# Patient Record
Sex: Male | Born: 1985 | Race: Asian | Hispanic: No | Marital: Married | Smoking: Never smoker
Health system: Southern US, Community
[De-identification: ages and names within clinical notes are randomized; demographics above are authoritative.]

---

## 2014-12-17 ENCOUNTER — Encounter (HOSPITAL_BASED_OUTPATIENT_CLINIC_OR_DEPARTMENT_OTHER): Payer: Self-pay | Admitting: *Deleted

## 2014-12-17 ENCOUNTER — Emergency Department (HOSPITAL_BASED_OUTPATIENT_CLINIC_OR_DEPARTMENT_OTHER): Payer: Commercial Managed Care - HMO

## 2014-12-17 ENCOUNTER — Emergency Department (HOSPITAL_BASED_OUTPATIENT_CLINIC_OR_DEPARTMENT_OTHER)
Admission: EM | Admit: 2014-12-17 | Discharge: 2014-12-17 | Disposition: A | Discharge status: Home / Self Care | Payer: Commercial Managed Care - HMO | Attending: Emergency Medicine | Admitting: Emergency Medicine

## 2014-12-17 DIAGNOSIS — S90852A Superficial foreign body, left foot, initial encounter: Secondary | ICD-10-CM | POA: Diagnosis not present

## 2014-12-17 DIAGNOSIS — Y9301 Activity, walking, marching and hiking: Secondary | ICD-10-CM | POA: Diagnosis not present

## 2014-12-17 DIAGNOSIS — Y9289 Other specified places as the place of occurrence of the external cause: Secondary | ICD-10-CM | POA: Diagnosis not present

## 2014-12-17 DIAGNOSIS — S90851A Superficial foreign body, right foot, initial encounter: Secondary | ICD-10-CM | POA: Diagnosis present

## 2014-12-17 DIAGNOSIS — Y998 Other external cause status: Secondary | ICD-10-CM | POA: Insufficient documentation

## 2014-12-17 DIAGNOSIS — W228XXA Striking against or struck by other objects, initial encounter: Secondary | ICD-10-CM | POA: Diagnosis not present

## 2014-12-17 MED ORDER — BACITRACIN ZINC 500 UNIT/GM EX OINT
1.0000 "application " | TOPICAL_OINTMENT | Freq: Two times a day (BID) | CUTANEOUS | Status: DC
  Administered 2014-12-17: 1 via TOPICAL
  Filled 2014-12-17: qty 28.35

## 2014-12-17 MED ORDER — CHLORHEXIDINE GLUCONATE 4 % EX LIQD: Freq: Every day | CUTANEOUS | Status: AC | PRN

## 2014-12-17 MED ORDER — IBUPROFEN 800 MG PO TABS
800.0000 mg | ORAL_TABLET | Freq: Once | ORAL | Status: AC
  Administered 2014-12-17: 800 mg via ORAL
  Filled 2014-12-17: qty 1

## 2014-12-17 NOTE — Discharge Instructions (Signed)
Sliver Removal °You have had a sliver (splinter) removed. This has caused a wound that extends through some or all layers of the skin and possibly into the subcutaneous tissue. This is the tissue just beneath the skin. Because these wounds can not be cleaned well, it is necessary to watch closely for infection. °AFTER THE PROCEDURE  °If a cut (incision) was necessary to remove this, it may have been repaired for you by your caregiver either with suturing, stapling, or adhesive strips. These keep together the skin edges and allow better and faster healing. °HOME CARE INSTRUCTIONS  °· A dressing may have been applied. This may be changed once per day or as instructed. If the dressing sticks, it may be soaked off with a gauze pad or clean cloth that has been dampened with soapy water or hydrogen peroxide. °· It is difficult to remove all slivers or foreign bodies as they may break or splinter into smaller pieces. Be aware that your body will work to remove the foreign substance. That is, the foreign body may work itself out of the wound. That is normal. °· Watch for signs of infection and notify your caregiver if you suspect a sliver or foreign body remains in the wound. °· You may have received a recommendation to follow up with your physician or a specialist. It is very important to call for or keep follow-up appointments in order to avoid infection or other complications. °· Only take over-the-counter or prescription medicines for pain, discomfort, or fever as directed by your caregiver. °· If antibiotics were prescribed, be sure to finish all of the medicine. °If you did not receive a tetanus shot today because you did not recall when your last one was given, check with your caregiver in the next day or two during follow up to determine if one is needed. °SEEK MEDICAL CARE IF:  °· The area around the wound has new or worsening redness or tenderness. °· Pus is coming from the wound °· There is a foul smell from the  wound or dressing °· The edges of a wound that had been repaired break open °SEEK IMMEDIATE MEDICAL CARE IF:  °· Red streaks are coming from the wound °· An unexplained oral temperature above 102° F (38.9° C) develops. °Document Released: 03/20/2000 Document Revised: 06/15/2011 Document Reviewed: 11/07/2007 °[employer]® Patient Information ©2015 [employer]. This information is not intended to replace advice given to you by your health care provider. Make sure you discuss any questions you have with your health care provider. ° °Wood Splinters °Wood splinters need to be removed because they can cause skin irritation and infection. If they are close to the surface, splinters can usually be removed easily. Deep splinters may be hard to locate and need treatment by a surgeon. °SPLINTER REMOVAL °Removal of splinters by your caregiver is considered a surgical procedure.  °· The area is carefully cleaned. You may require a small amount of anesthesia (medicine injected near the splinter to numb the tissue and lessen pain). After the splinter is removed, the area will be cleaned again. A bandage is applied. °· If your splinter is under a fingernail or toenail, then a small section of the nail may need to be removed. As long as the splinter did not extend to the base of the nail, the nail usually grows back normally. °· A splinter that is deeper, more contaminated, or that gets near a structure such as a bone, nerve or blood vessel may need to be   removed by a surgeon. °· You may need special X-rays or scans if the splinter is hard to locate. °· Every attempt is made to remove the entire splinter. However, small particles may remain. Tell your caregiver if you feel that a part of the splinter was left behind. °HOME CARE INSTRUCTIONS  °· Keep the injured area high up (elevated). °· Use the injured area as little as possible. °· Keep the injured area clean and dry. Follow any directions from your caregiver. °· Keep any  follow-up or wound check appointments. °You might need a tetanus shot now if: °· You have no idea when you had the last one. °· You have never had a tetanus shot before. °· The injured area had dirt in it. °Even if you have already removed the splinter, call your caregiver to get a tetanus shot if you need one.  °If you need a tetanus shot, and you decide not to get one, there is a rare chance of getting tetanus. Sickness from tetanus can be serious. If you did get a tetanus shot, your arm may swell, get red and warm to the touch at the shot site. This is common and not a problem. °SEEK MEDICAL CARE IF:  °· A splinter has been removed, but you are not better in a day or two. °· You develop a temperature. °· Signs of infection develop such as: °¨ Redness, swelling or pus around the wound. °¨ Red streaks spreading back from your wound towards your body. °Document Released: 04/30/2004 Document Revised: 08/07/2013 Document Reviewed: 04/02/2008 °[employer]® Patient Information ©2015 [employer]. This information is not intended to replace advice given to you by your health care provider. Make sure you discuss any questions you have with your health care provider. ° °

## 2014-12-17 NOTE — ED Notes (Signed)
He was walking bare foot yesterday and got thorns in the bottoms of his feet. Painful.

## 2014-12-17 NOTE — ED Provider Notes (Signed)
CSN: [id number]     Arrival date & time 12/17/14  2059 History  This chart was scribed for non-physician practitioner, Ivar Drape, PA-C, working with Gwyneth Sprout, MD, by Ronney Lion, ED Scribe. This patient was seen in room MH10/MH10 and the patient's care was started at 10:15 PM.    Chief Complaint  Patient presents with  . Foreign Body in Skin   The history is provided by the patient. No language interpreter was used.    HPI Comments: Dale Howell is a 29 y.o. male who presents to the Emergency Department for evaluation after walking barefoot yesterday and lodging thorns in the soles of his bilateral feet. He states he removed both thorns but still feels constant, moderate, persistent pain. He repeatedly expresses concern over the possibility of infection and requests oral antibiotics. Patient states his last tetanus vaccine was in 2015. He denies any other symptoms.  History reviewed. No pertinent past medical history. History reviewed. No pertinent past surgical history. No family history on file. Social History  Substance Use Topics  . Smoking status: Never Smoker   . Smokeless tobacco: None  . Alcohol Use: No    Review of Systems  Musculoskeletal: Positive for myalgias (pain in bilateral feet in areas of thorn punctures).  All other systems reviewed and are negative.  Allergies  Review of patient's allergies indicates no known allergies.  Home Medications   Prior to Admission medications   Not on File   BP 148/82 mmHg  Pulse 80  Temp(Src) 98.4 F (36.9 C) (Oral)  Resp 18  Ht 5\' 11"  (1.803 m)  Wt 277 lb (125.646 kg)  BMI 38.65 kg/m2  SpO2 99% Physical Exam  Constitutional: He is oriented to person, place, and time. He appears well-developed and well-nourished. No distress.  HENT:  Head: Normocephalic and atraumatic.  Eyes: Conjunctivae and EOM are normal.  Neck: Neck supple. No tracheal deviation present.  Cardiovascular: Normal rate.   Pulmonary/Chest:  Effort normal. No respiratory distress.  Musculoskeletal: Normal range of motion.  Neurological: He is alert and oriented to person, place, and time.  Skin: Skin is warm and dry.  Very small thorn in right foot, approximately 1mm long  Psychiatric: He has a normal mood and affect. His behavior is normal.  Nursing note and vitals reviewed.   ED Course  FOREIGN BODY REMOVAL Date/Time: 12/17/2014 10:47 PM Performed by: Roxy Horseman Authorized by: Roxy Horseman Consent: Verbal consent obtained. Consent given by: patient Patient understanding: patient states understanding of the procedure being performed Patient consent: the patient's understanding of the procedure matches consent given Procedure consent: procedure consent matches procedure scheduled Relevant documents: relevant documents present and verified Test results: test results available and properly labeled Site marked: the operative site was marked Imaging studies: imaging studies available Required items: required blood products, implants, devices, and special equipment available Patient identity confirmed: verbally with patient Body area: skin General location: lower extremity Location details: right foot Patient sedated: no Patient restrained: no Patient cooperative: yes Localization method: visualized Tendon involvement: none Depth: subcutaneous Complexity: simple 1 objects recovered. Objects recovered: splinter/thorn Post-procedure assessment: foreign body removed Patient tolerance: Patient tolerated the procedure well with no immediate complications   (including critical care time)  DIAGNOSTIC STUDIES: Oxygen Saturation is 99% on RA, normal by my interpretation.    COORDINATION OF CARE: 10:18 PM - XR findings reviewed with pt. Discussed treatment plan with pt at bedside which includes removal of tiny thorn fragment, application of antibiotic ointment, and dressing  wounds. Discussed with pt that I do not  believe oral antibiotics are warranted at this time, as patient shows no fever or any infectious signs. Pt verbalized understanding and agreed to plan.   Imaging Review Dg Foot Complete Left  12/17/2014   CLINICAL DATA:  29 year old male with possible foreign body.  EXAM: LEFT FOOT - COMPLETE 3+ VIEW  COMPARISON:  None.  FINDINGS: There is no evidence of fracture or dislocation. There is no evidence of arthropathy or other focal bone abnormality. Soft tissues are unremarkable. No radiopaque foreign object identified.  IMPRESSION: Negative.   Electronically Signed   By: Elgie Collard M.D.   On: 12/17/2014 21:50   Dg Foot Complete Right  12/17/2014   CLINICAL DATA:  Stepped on thorns with right foot, rule out foreign body.  EXAM: RIGHT FOOT COMPLETE - 3+ VIEW  COMPARISON:  None.  FINDINGS: There is no evidence of fracture or dislocation. There is no evidence of arthropathy or other focal bone abnormality. Soft tissues are unremarkable.  IMPRESSION: Negative.   Electronically Signed   By: Elberta Fortis M.D.   On: 12/17/2014 21:49   I have personally reviewed and evaluated these images and lab results as part of my medical decision-making.  MDM   Final diagnoses:  Foreign body in foot, right, initial encounter  Foreign body in foot, left, initial encounter   Patient with very small thorn in his foot, and complaints of other thorns which are not visualized.  The single visualized thorn removed without complication. Plain films are negative. I do not see no date any additional thorns. Will apply bacitracin. Will recommend close monitoring. Patient understands and agrees to plan. He is stable and ready for discharge.  I personally performed the services described in this documentation, which was scribed in my presence. The recorded information has been reviewed and is accurate.     Roxy Horseman, PA-C 12/17/14 2248  Gwyneth Sprout, MD 12/18/14 [phone]

## 2016-09-19 IMAGING — DX DG FOOT COMPLETE 3+V*R*
3 series · 3 of 3 positions shown · non-contrast
Comparison: None.

CLINICAL DATA: Stepped on thorns with right foot, rule out foreign
body.

EXAM:
RIGHT FOOT COMPLETE - 3+ VIEW

[foot ap]
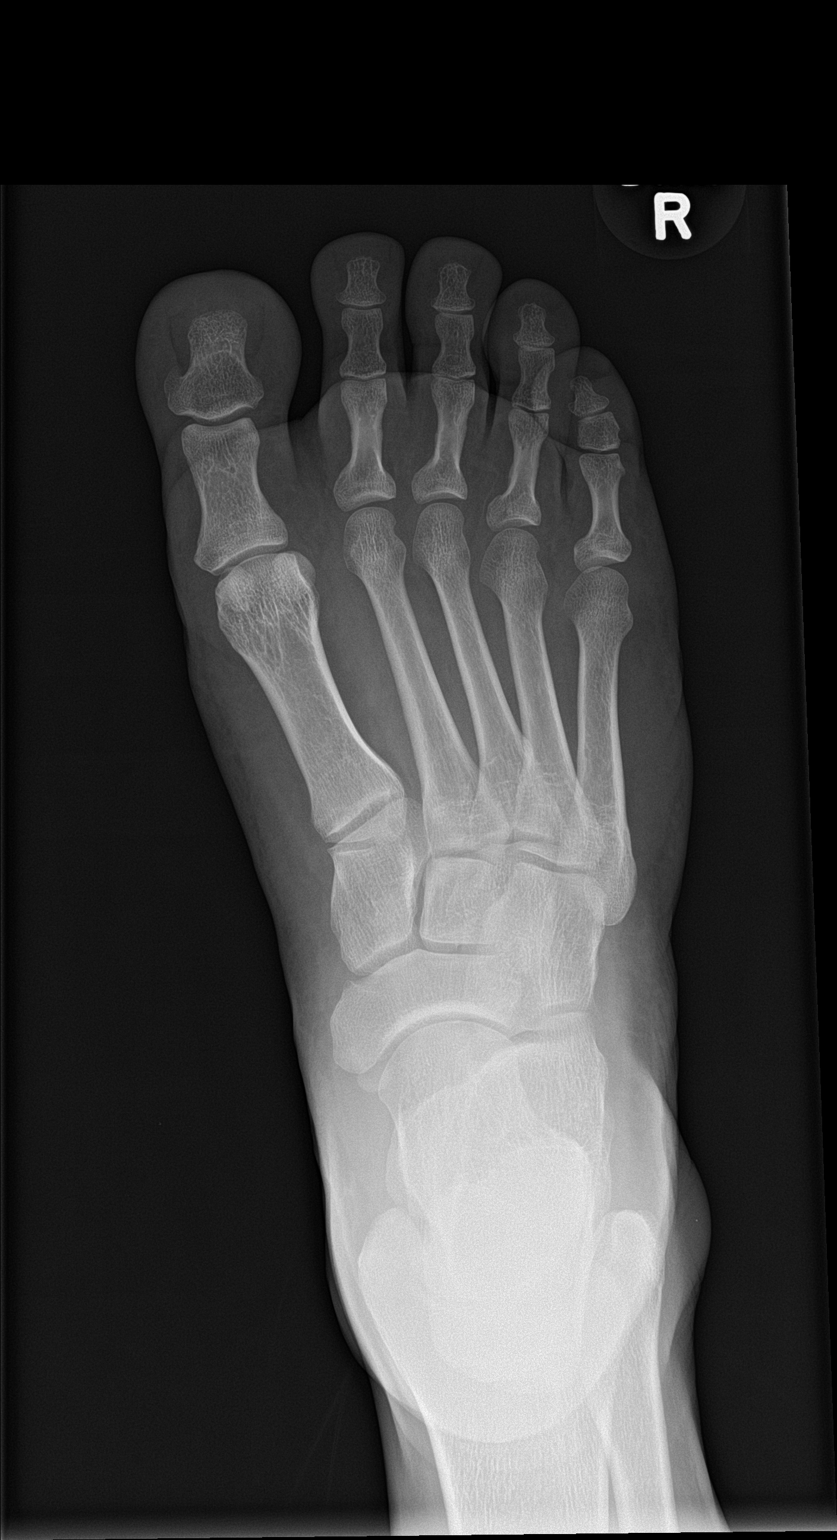

[foot obl]
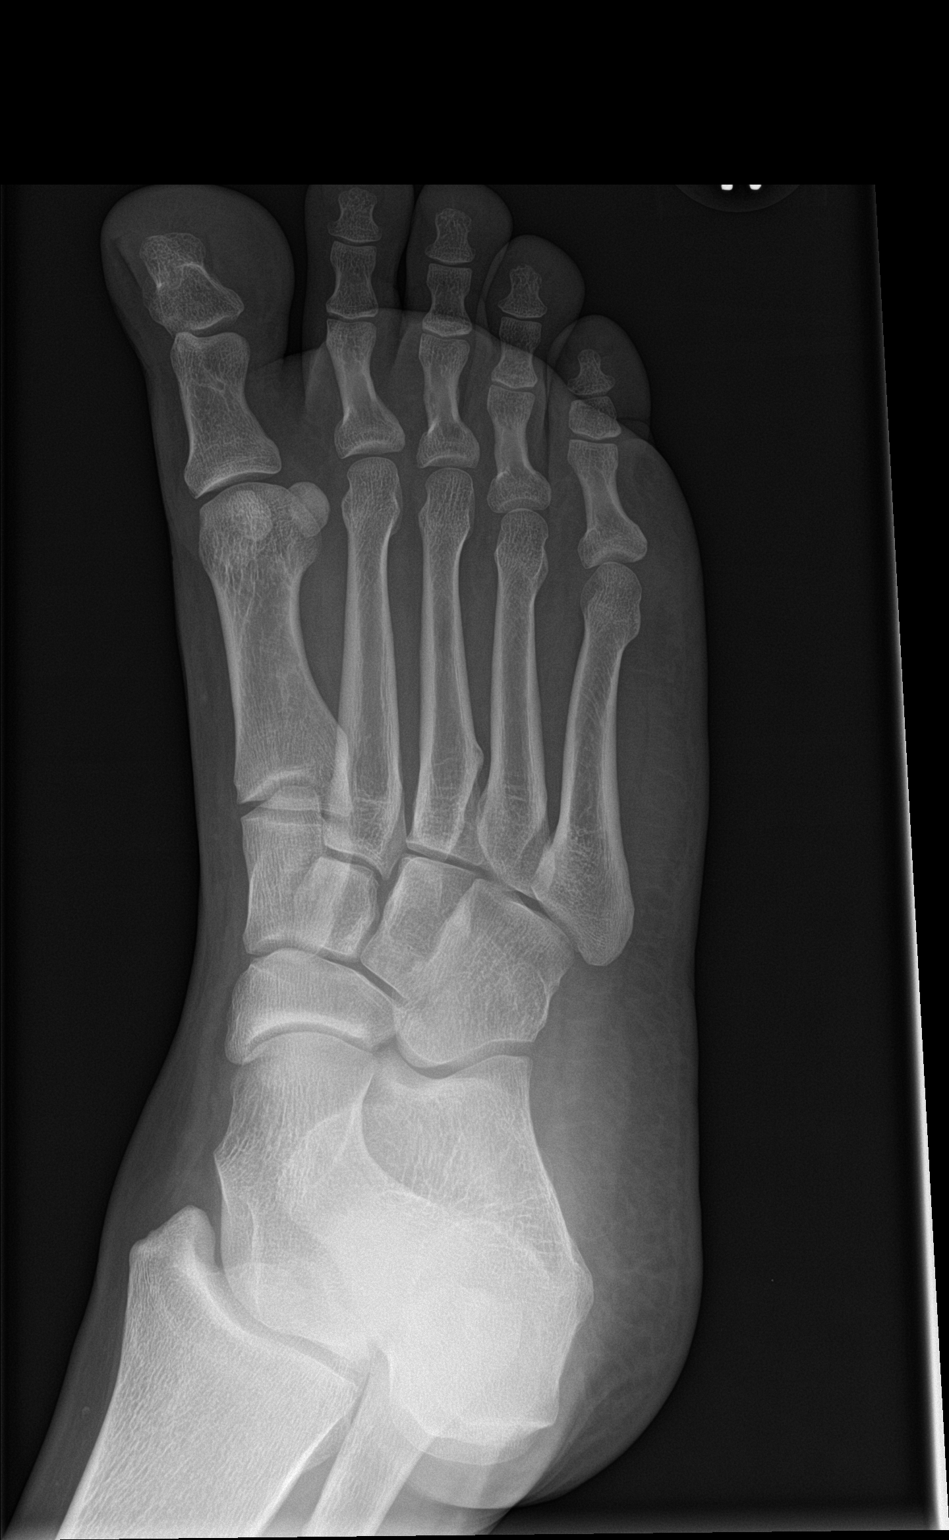

[foot lat]
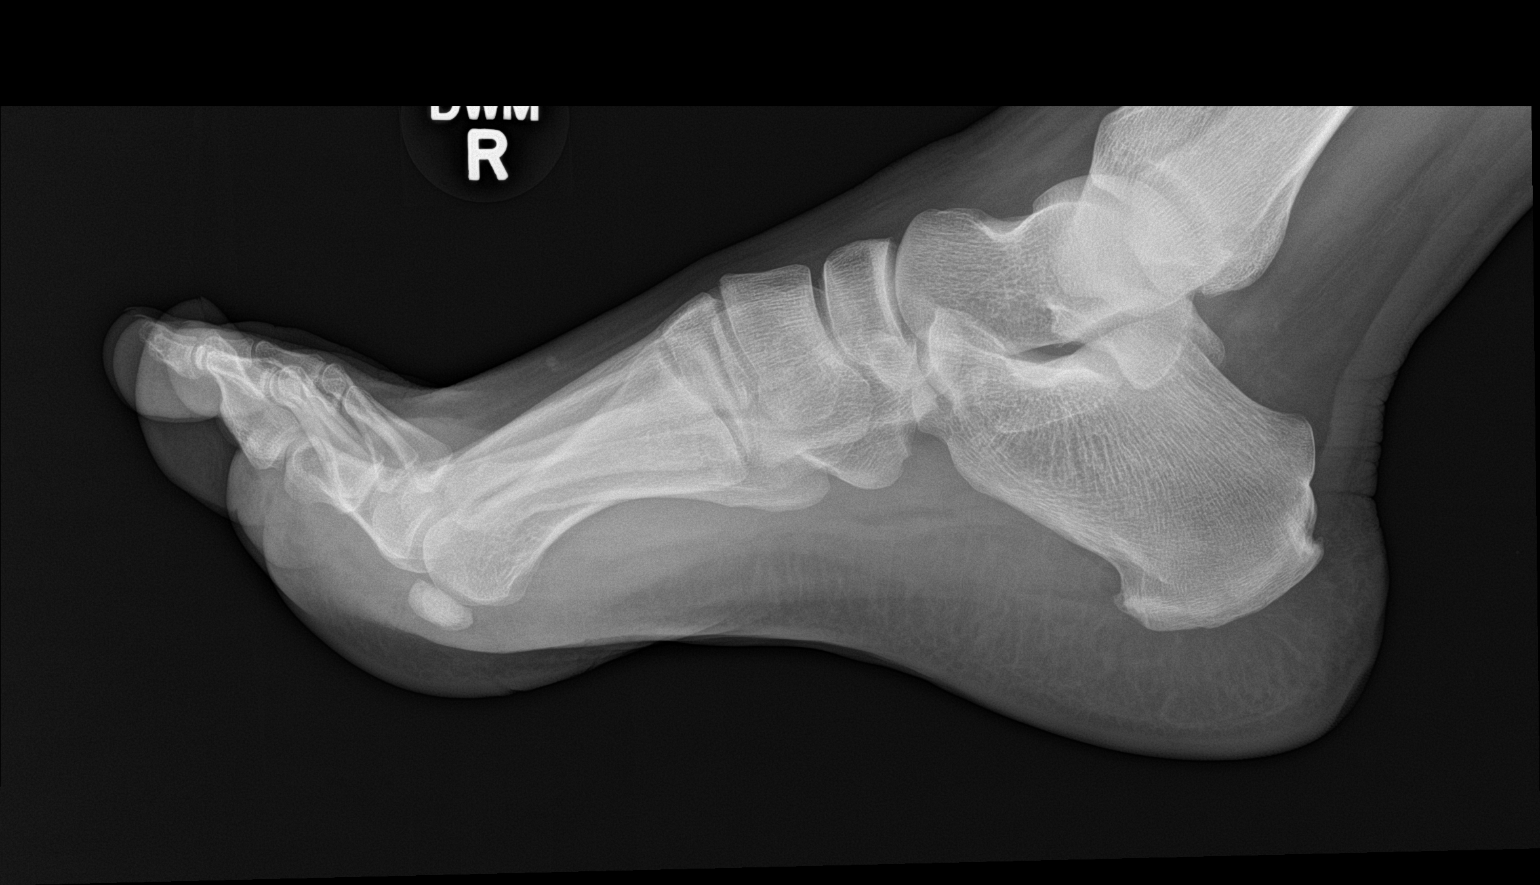

[3 of 3 positions shown; findings below may reference images not displayed]

FINDINGS: There is no evidence of fracture or dislocation. There is no
evidence of arthropathy or other focal bone abnormality. Soft
tissues are unremarkable.
IMPRESSION: Negative.

## 2016-09-19 IMAGING — DX DG FOOT COMPLETE 3+V*L*
3 series · 3 of 3 positions shown · non-contrast
Comparison: None.

CLINICAL DATA: 29-year-old male with possible foreign body.

EXAM:
LEFT FOOT - COMPLETE 3+ VIEW

[foot ap]
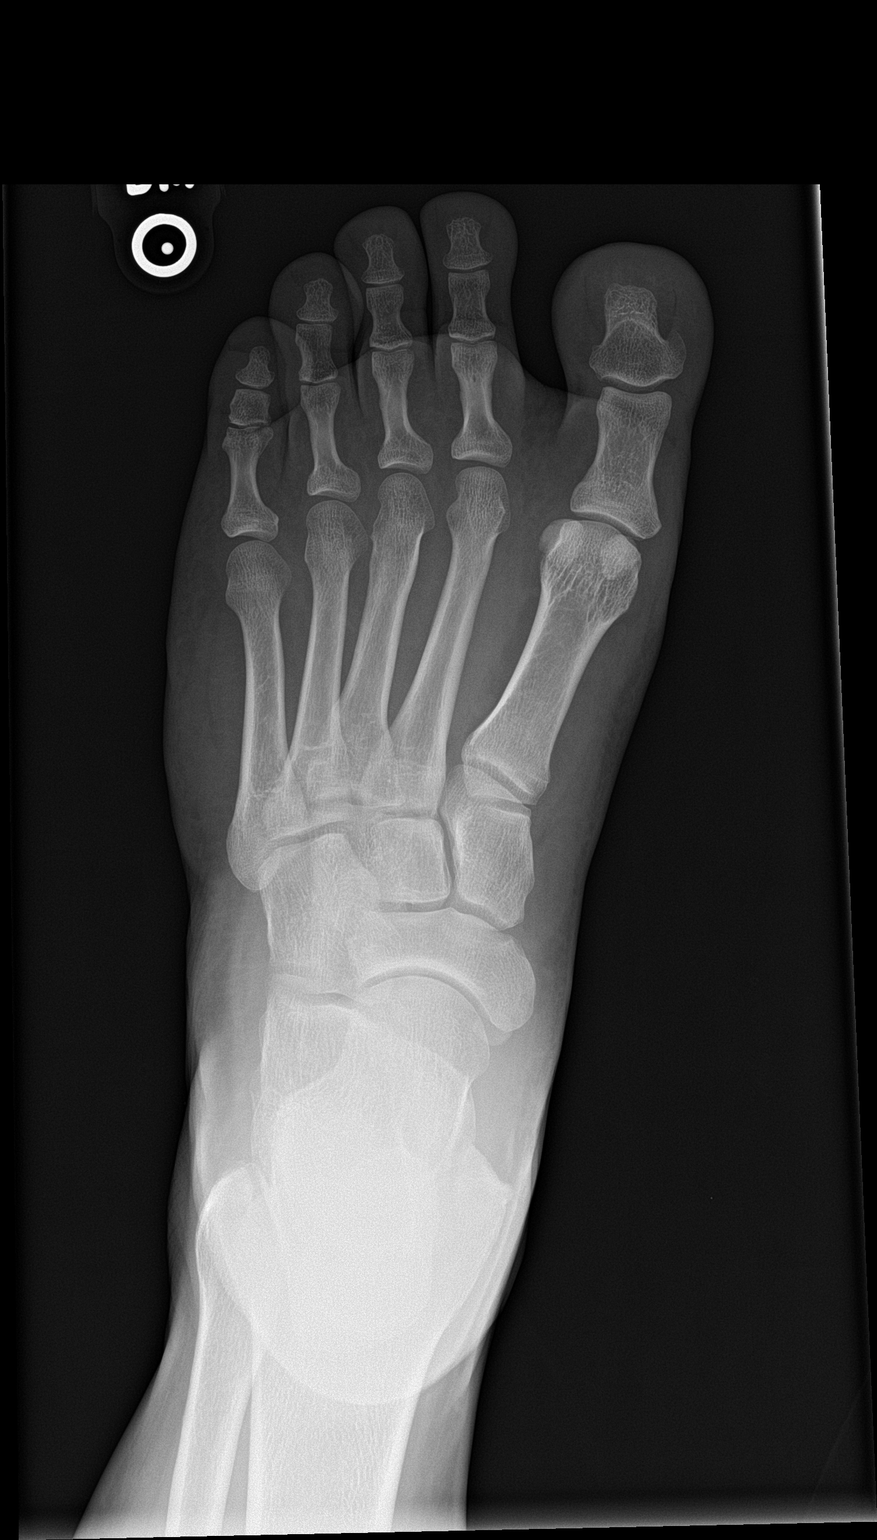

[foot obl]
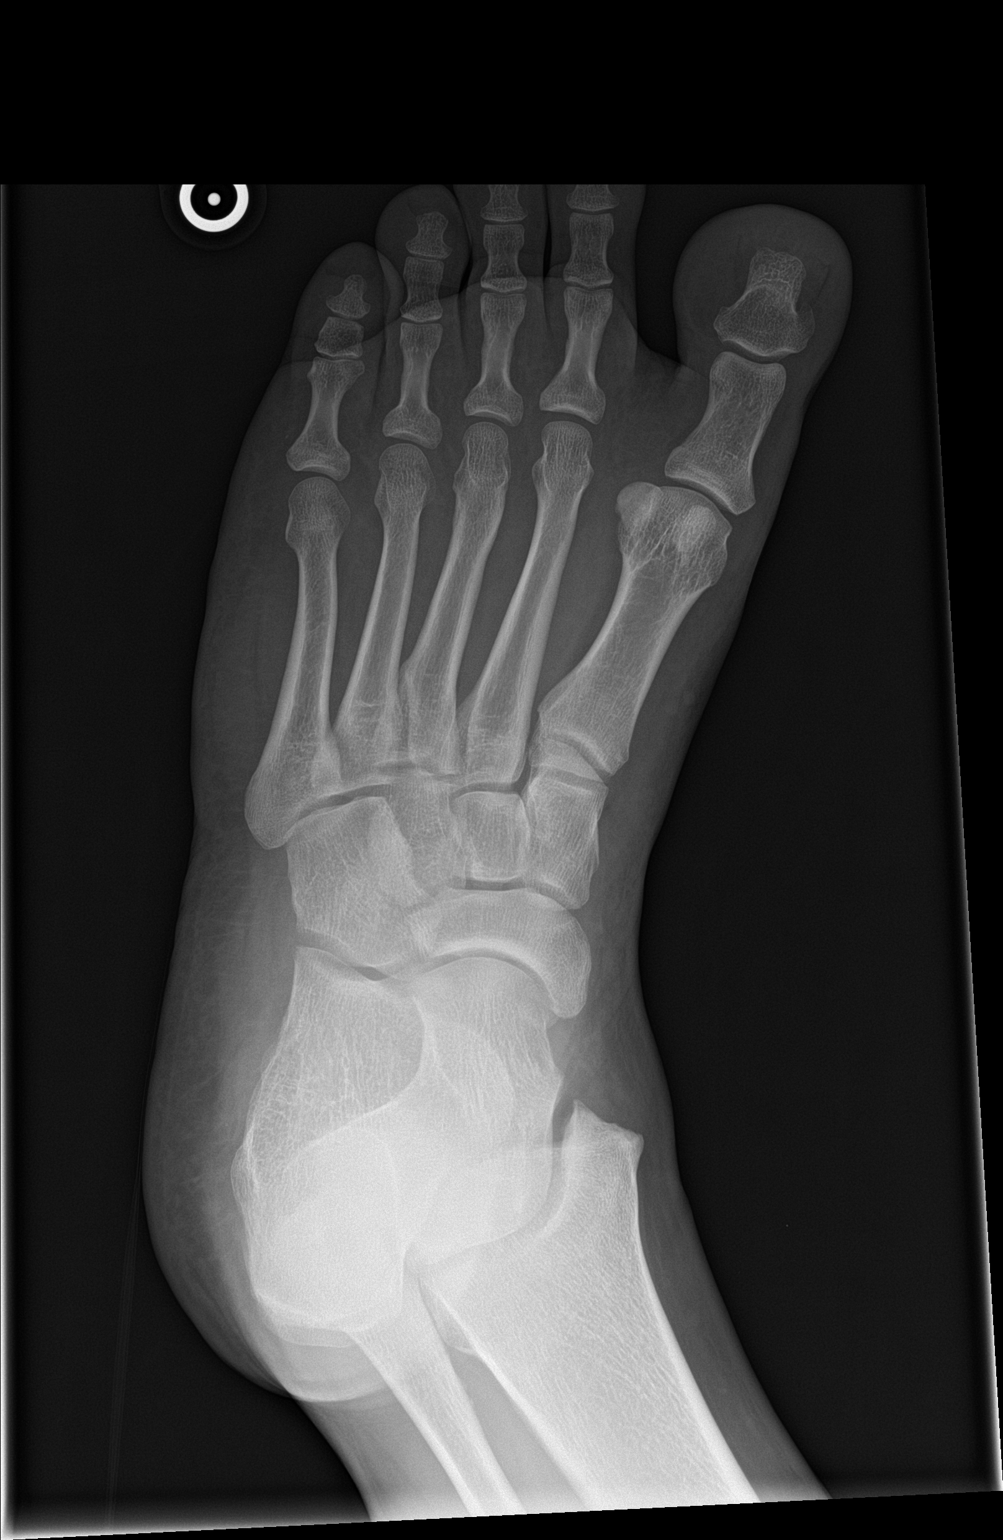

[foot lat]
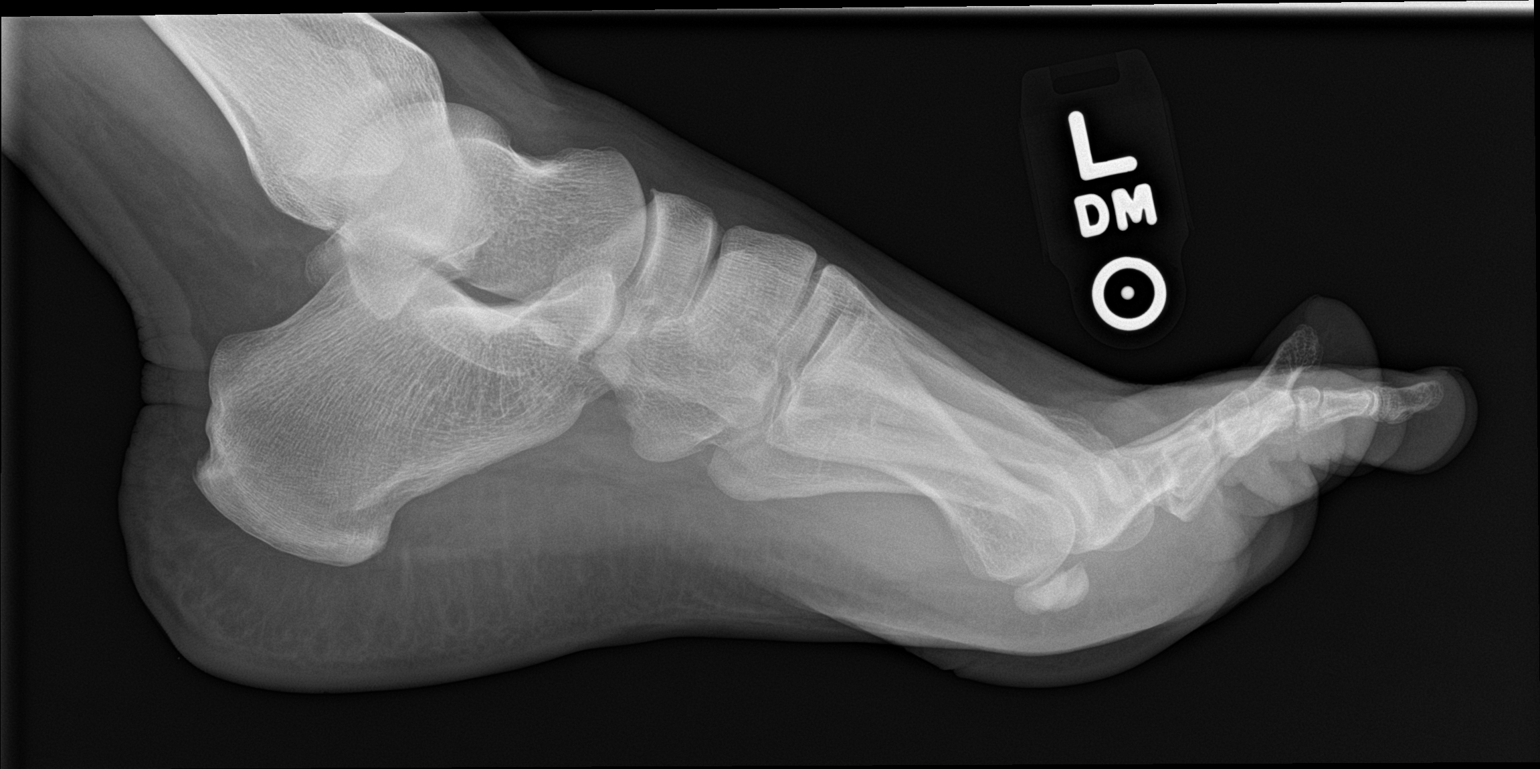

[3 of 3 positions shown; findings below may reference images not displayed]

FINDINGS: There is no evidence of fracture or dislocation. There is no
evidence of arthropathy or other focal bone abnormality. Soft
tissues are unremarkable. No radiopaque foreign object identified.
IMPRESSION: Negative.
